# Patient Record
Sex: Female | Born: 1955 | Hispanic: No
Health system: Southern US, Community
[De-identification: ages and names within clinical notes are randomized; demographics above are authoritative.]

---

## 2004-01-11 ENCOUNTER — Other Ambulatory Visit: Admission: RE | Admit: 2004-01-11 | Discharge: 2004-01-11 | Payer: Self-pay | Admitting: Obstetrics and Gynecology

## 2004-03-21 ENCOUNTER — Ambulatory Visit: Payer: Self-pay | Admitting: Internal Medicine

## 2004-05-03 ENCOUNTER — Inpatient Hospital Stay (HOSPITAL_COMMUNITY): Admission: RE | Admit: 2004-05-03 | Discharge: 2004-05-04 | Payer: Self-pay | Admitting: Obstetrics and Gynecology

## 2004-05-16 ENCOUNTER — Inpatient Hospital Stay (HOSPITAL_COMMUNITY): Admission: AD | Admit: 2004-05-16 | Discharge: 2004-05-23 | Payer: Self-pay | Admitting: Obstetrics and Gynecology

## 2005-07-14 IMAGING — CR DG ABDOMEN 1V
1 series · 1 of 1 positions shown · non-contrast
Comparison: None. A CT scan from 05/16/2004 has been reviewed

ABDOMEN - 1 VIEW

CLINICAL DATA: Postop abdominal pain.

[view not recorded]
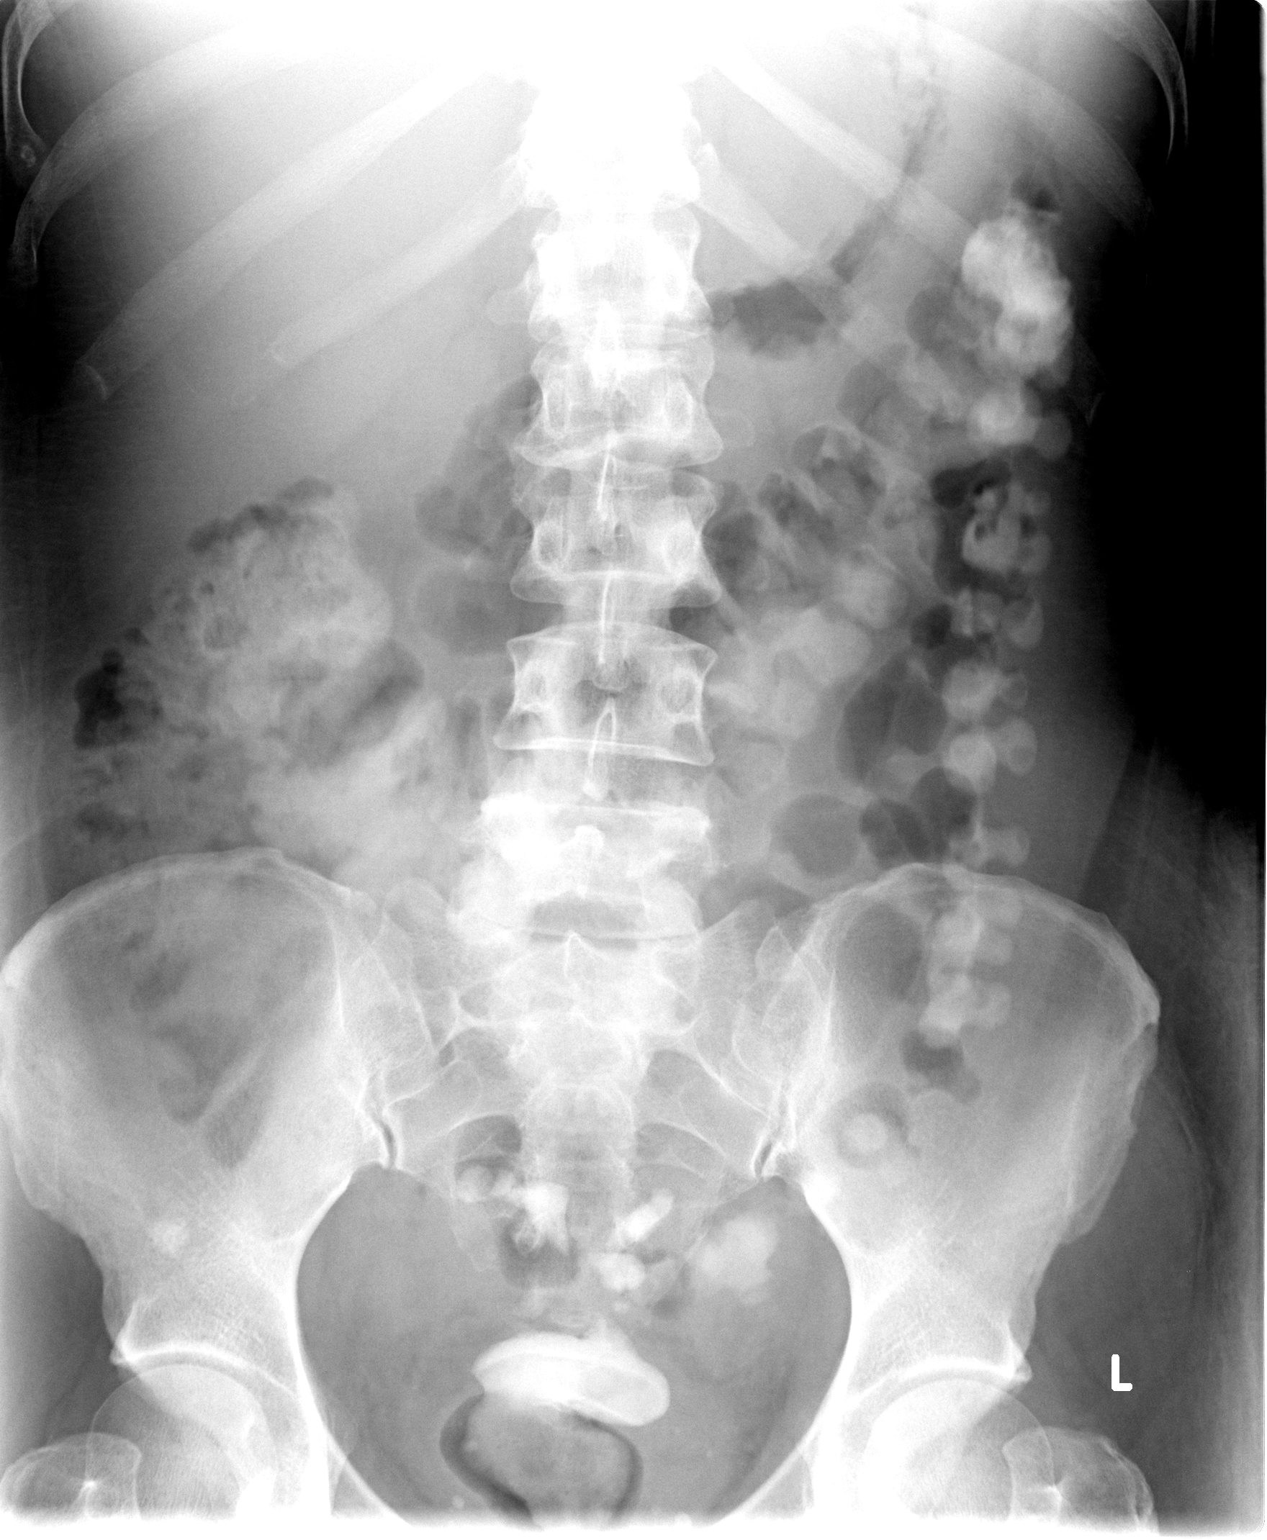

[1 of 1 positions shown; findings below may reference images not displayed]

FINDINGS: Gas pattern is nonspecific. Enteric contrast from the CT scan 3 days
ago is now in the nondilated colon.

IMPRESSION

No evidence for bowel obstruction or ileus.

## 2005-07-16 IMAGING — CT CT ABDOMEN W/ CM
1 of 3 series · 13 of 32 positions shown, 18 images · IV contrast (900 CC REDICAT & 100 CC OMNI 300)
Comparison: 05/16/2004.

CLINICAL DATA: Endoscopy yesterday. Evaluate for perforation.
TECHNIQUE: 100 cc of Omnipaque 300.

[Series 2: — · axial · 0.59mm/px · z∈[-404,-24]mm · 13 of 86 slices shown, 18 images]
[im 5/86  soft-tissue]
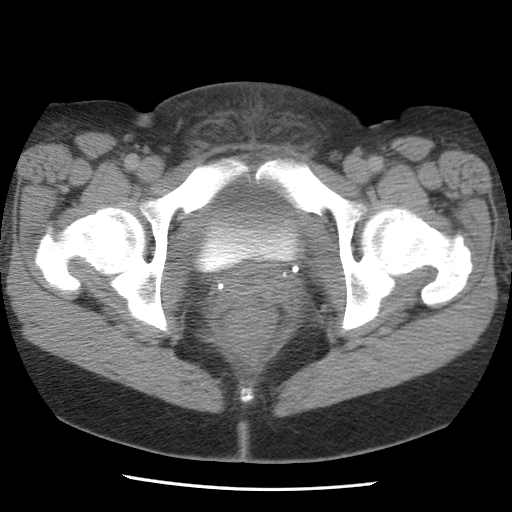
[im 5/86  bone]
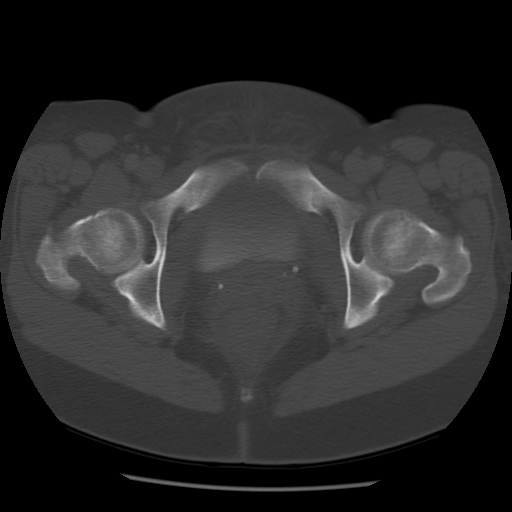
[im 15/86  soft-tissue]
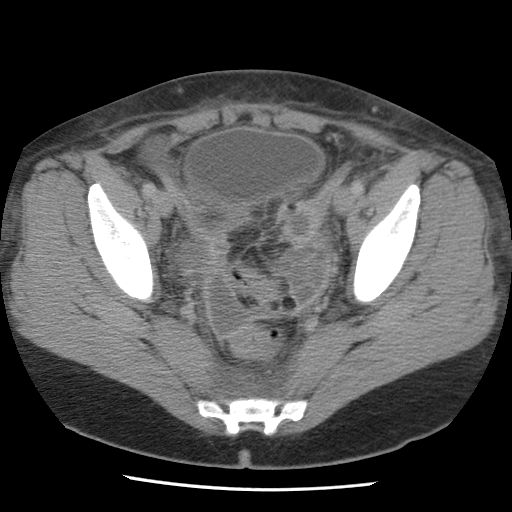
[im 19/86  soft-tissue]
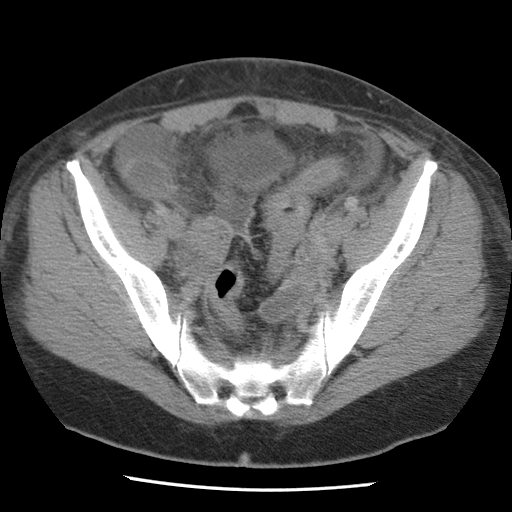
[im 24/86  soft-tissue]
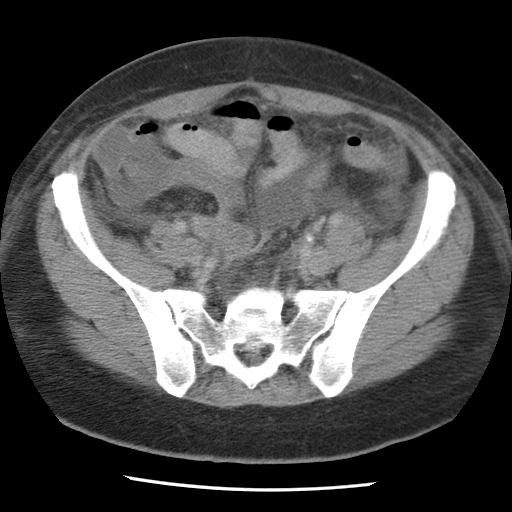
[im 34/86  soft-tissue]
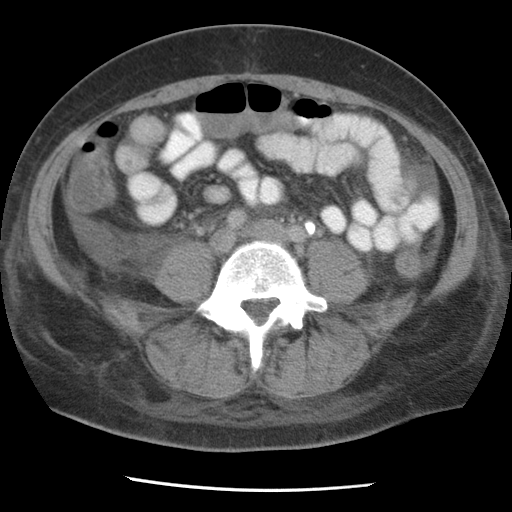
[im 38/86  soft-tissue]
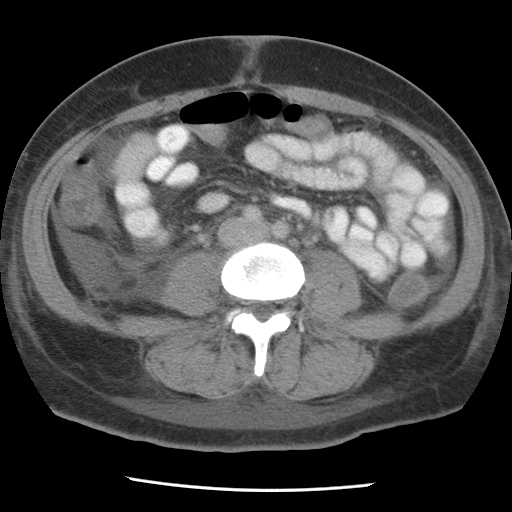
[im 48/86  soft-tissue]
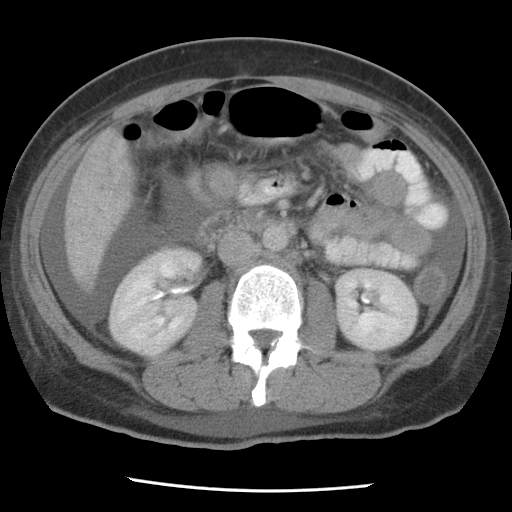
[im 52/86  soft-tissue]
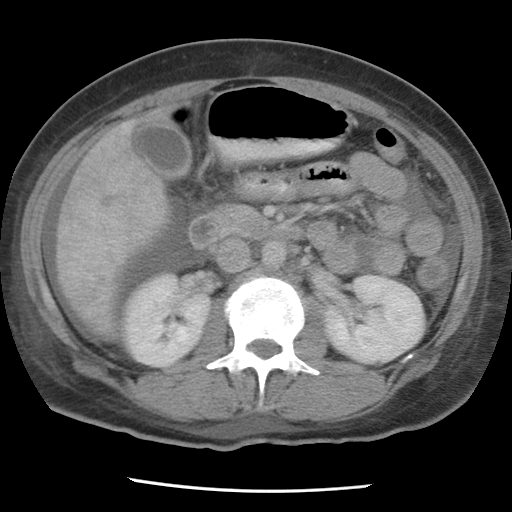
[im 62/86  soft-tissue]
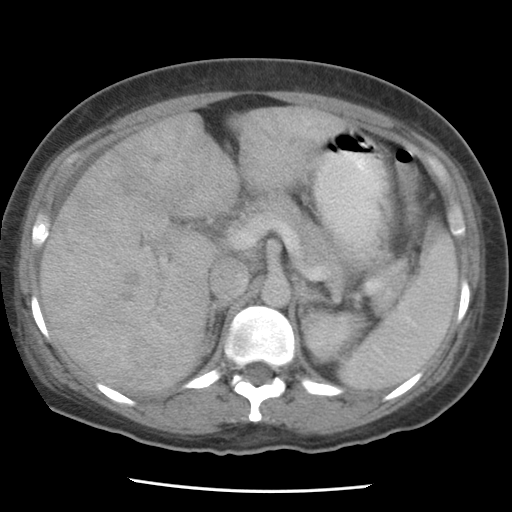
[im 62/86  bone]
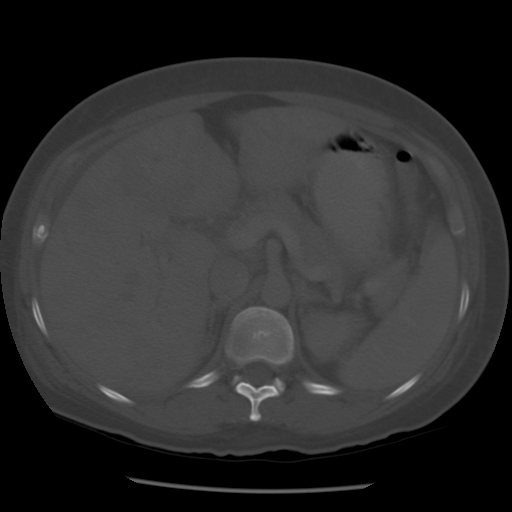
[im 67/86  soft-tissue]
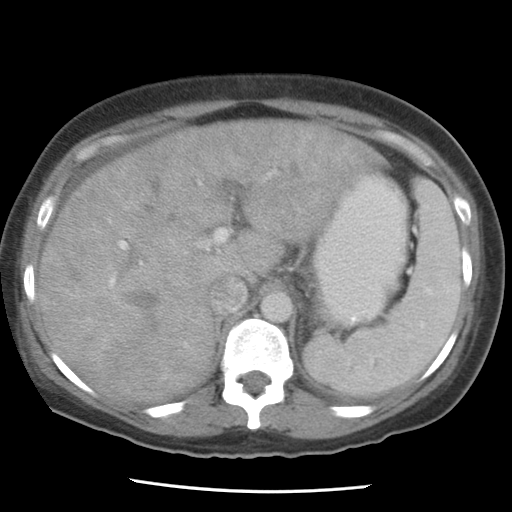
[im 67/86  lung]
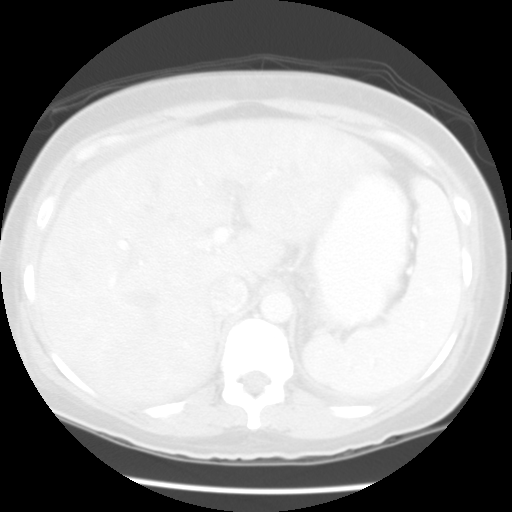
[im 71/86  soft-tissue]
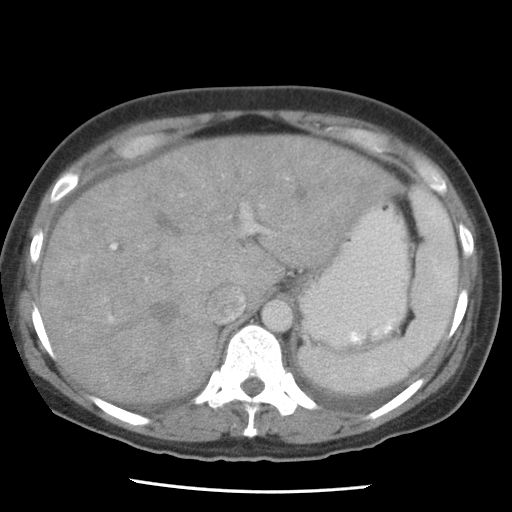
[im 71/86  lung]
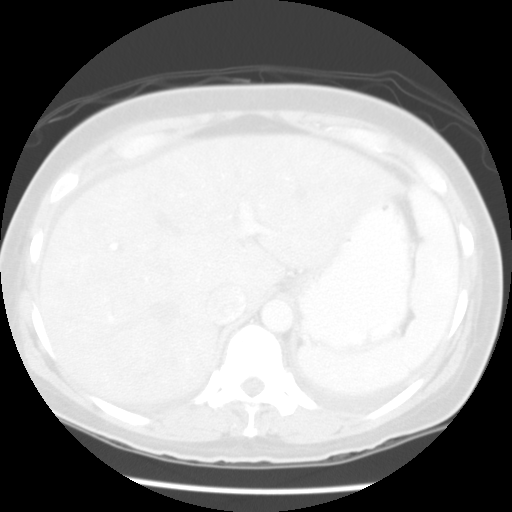
[im 76/86  lung]
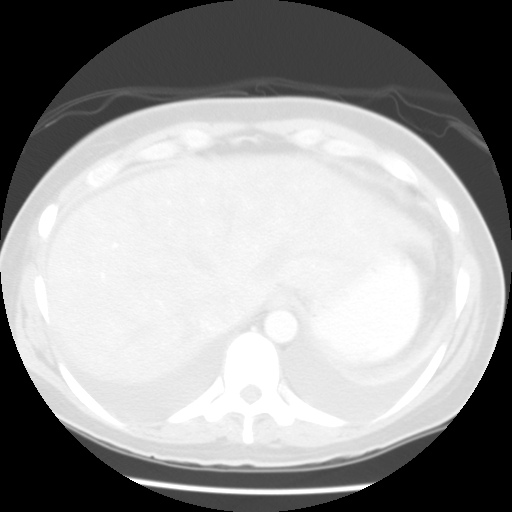
[im 81/86  soft-tissue]
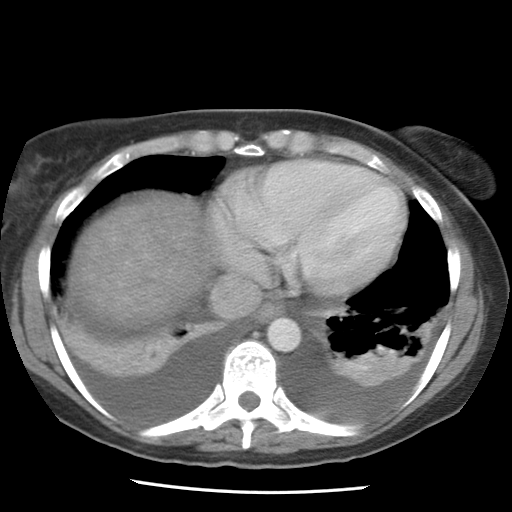
[im 81/86  lung]
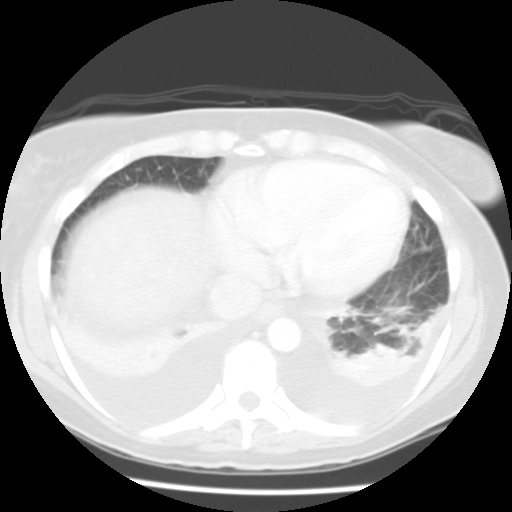

[13 of 32 positions shown; findings below may reference images not displayed]

ABDOMEN CT WITH CONTRAST:
 Moderate bilateral effusions and bibasilar atelectasis have developed. The hepatic veins are nonopacified.  The liver has a very patchy and heterogeneous enhancement pattern.  The IVC is somewhat distended.  
 Small to moderate ascites is present in the abdomen.  Fluid is noted adjacent to the gallbladder.  Contrast is seen in the proximal small bowel.  There is no contrast in the colon.  There is no free intraperitoneal gas.  No loculated fluid collection is present in the abdomen.  Stranding is seen throughout the retroperitoneal fat.
IMPRESSION: 1. Bilateral effusions and bibasilar atelectasis. 
 2. Ascites.  If the patient had endoscopy, bowel perforation cannot be excluded given free intraperitoneal fluid.  There is however no free intraperitoneal gas.  
 3. Patchy enhancement pattern of the liver with the appearance of passive congestion.   This can be seen with hepatic venous occlusion or right heart failure. 
 4.  Pericholecystic fluid.  No gallstones are seen. This is nonspecific. 
 PELVIS CT WITH CONTRAST:
 The loculated fluid collection in the left side of the pelvis previously noted is smaller.  It measures 1.7 x 2.1 cm.   All bowel loops in the pelvis are nonopacified therefore it would be difficult to exclude an additional loculated fluid collection.  Other fluid collections may represent an abscess versus unopacified small bowel.  Particularly in the right side of the pelvis on image 72, there is a fluid collection which may represent other loculated fluid collections.  There is a persistent amount of air in the previously mentioned loculated fluid collection in the left side of the pelvis worrisome for an abscess. Stranding is noted in the retroperitoneal fat.
IMPRESSION: Loculated fluid collections in the pelvis as described. The previously described air fluid abscess in the left side of the pelvis has decreased in size.

## 2018-09-24 ENCOUNTER — Telehealth: Payer: Self-pay

## 2018-09-24 NOTE — Telephone Encounter (Signed)
Opened in error
